# Patient Record
Sex: Male | Born: 1980 | Race: Black or African American | Hispanic: No | Marital: Married | Smoking: Never smoker
Health system: Southern US, Community
[De-identification: ages and names within clinical notes are randomized; demographics above are authoritative.]

## PROBLEM LIST (undated history)

## (undated) DIAGNOSIS — G43909 Migraine, unspecified, not intractable, without status migrainosus: Secondary | ICD-10-CM

## (undated) DIAGNOSIS — J329 Chronic sinusitis, unspecified: Secondary | ICD-10-CM

## (undated) DIAGNOSIS — J302 Other seasonal allergic rhinitis: Secondary | ICD-10-CM

## (undated) HISTORY — PX: TONSILLECTOMY: SUR1361

---

## 2003-08-19 ENCOUNTER — Emergency Department (HOSPITAL_COMMUNITY): Admission: EM | Admit: 2003-08-19 | Discharge: 2003-08-19 | Payer: Self-pay | Admitting: Emergency Medicine

## 2004-10-25 ENCOUNTER — Emergency Department (HOSPITAL_COMMUNITY): Admission: EM | Admit: 2004-10-25 | Discharge: 2004-10-25 | Payer: Self-pay | Admitting: Emergency Medicine

## 2004-10-27 ENCOUNTER — Emergency Department (HOSPITAL_COMMUNITY): Admission: EM | Admit: 2004-10-27 | Discharge: 2004-10-27 | Payer: Self-pay | Admitting: Emergency Medicine

## 2005-02-02 ENCOUNTER — Emergency Department (HOSPITAL_COMMUNITY): Admission: EM | Admit: 2005-02-02 | Discharge: 2005-02-02 | Payer: Self-pay | Admitting: Emergency Medicine

## 2005-11-05 ENCOUNTER — Emergency Department (HOSPITAL_COMMUNITY): Admission: EM | Admit: 2005-11-05 | Discharge: 2005-11-05 | Payer: Self-pay | Admitting: Emergency Medicine

## 2005-11-18 ENCOUNTER — Ambulatory Visit (HOSPITAL_COMMUNITY): Admission: RE | Admit: 2005-11-18 | Discharge: 2005-11-18 | Payer: Self-pay | Admitting: Chiropractic Medicine

## 2006-11-26 ENCOUNTER — Emergency Department (HOSPITAL_COMMUNITY): Admission: EM | Admit: 2006-11-26 | Discharge: 2006-11-26 | Payer: Self-pay | Admitting: Emergency Medicine

## 2006-12-02 ENCOUNTER — Emergency Department (HOSPITAL_COMMUNITY): Admission: EM | Admit: 2006-12-02 | Discharge: 2006-12-02 | Payer: Self-pay | Admitting: Emergency Medicine

## 2006-12-04 ENCOUNTER — Emergency Department (HOSPITAL_COMMUNITY): Admission: EM | Admit: 2006-12-04 | Discharge: 2006-12-04 | Payer: Self-pay | Admitting: Emergency Medicine

## 2007-03-30 ENCOUNTER — Emergency Department (HOSPITAL_COMMUNITY): Admission: EM | Admit: 2007-03-30 | Discharge: 2007-03-31 | Payer: Self-pay | Admitting: Emergency Medicine

## 2007-09-18 ENCOUNTER — Emergency Department (HOSPITAL_COMMUNITY): Admission: EM | Admit: 2007-09-18 | Discharge: 2007-09-18 | Payer: Self-pay | Admitting: Emergency Medicine

## 2011-06-22 ENCOUNTER — Emergency Department (HOSPITAL_COMMUNITY)
Admission: EM | Admit: 2011-06-22 | Discharge: 2011-06-22 | Disposition: A | Discharge status: Home / Self Care | Payer: Self-pay | Attending: Emergency Medicine | Admitting: Emergency Medicine

## 2011-06-22 ENCOUNTER — Encounter (HOSPITAL_COMMUNITY): Payer: Self-pay | Admitting: *Deleted

## 2011-06-22 DIAGNOSIS — R51 Headache: Secondary | ICD-10-CM | POA: Insufficient documentation

## 2011-06-22 DIAGNOSIS — R0602 Shortness of breath: Secondary | ICD-10-CM | POA: Insufficient documentation

## 2011-06-22 DIAGNOSIS — IMO0001 Reserved for inherently not codable concepts without codable children: Secondary | ICD-10-CM | POA: Insufficient documentation

## 2011-06-22 DIAGNOSIS — R109 Unspecified abdominal pain: Secondary | ICD-10-CM | POA: Insufficient documentation

## 2011-06-22 DIAGNOSIS — J111 Influenza due to unidentified influenza virus with other respiratory manifestations: Secondary | ICD-10-CM | POA: Insufficient documentation

## 2011-06-22 DIAGNOSIS — R5381 Other malaise: Secondary | ICD-10-CM | POA: Insufficient documentation

## 2011-06-22 DIAGNOSIS — R1110 Vomiting, unspecified: Secondary | ICD-10-CM | POA: Insufficient documentation

## 2011-06-22 DIAGNOSIS — R5383 Other fatigue: Secondary | ICD-10-CM | POA: Insufficient documentation

## 2011-06-22 HISTORY — DX: Other seasonal allergic rhinitis: J30.2

## 2011-06-22 HISTORY — DX: Chronic sinusitis, unspecified: J32.9

## 2011-06-22 HISTORY — DX: Migraine, unspecified, not intractable, without status migrainosus: G43.909

## 2011-06-22 MED ORDER — OSELTAMIVIR PHOSPHATE 75 MG PO CAPS: 75.0000 mg | ORAL_CAPSULE | Freq: Two times a day (BID) | ORAL | Status: AC

## 2011-06-22 MED ORDER — ONDANSETRON HCL 4 MG PO TABS: 4.0000 mg | ORAL_TABLET | Freq: Four times a day (QID) | ORAL | Status: AC

## 2011-06-22 MED ORDER — ACETAMINOPHEN 325 MG PO TABS
650.0000 mg | ORAL_TABLET | Freq: Once | ORAL | Status: AC
  Administered 2011-06-22: 650 mg via ORAL
  Filled 2011-06-22: qty 2

## 2011-06-22 NOTE — ED Notes (Signed)
C/o nausea, "feeling sick", onset yesterday noon, also HA, sob, weak legged & body aches. Took an old amox PTA (his Rx from dentist).

## 2011-06-22 NOTE — ED Notes (Signed)
Pt given grape juice 

## 2011-06-22 NOTE — ED Notes (Signed)
EDP at BS, family at BS. 

## 2011-06-22 NOTE — ED Notes (Signed)
Straight back to room from front door, not registered or triaged, back to exam room d/t HR, HR 118 at this time.

## 2011-06-22 NOTE — ED Provider Notes (Signed)
History     CSN: [id number]  Arrival date & time 06/22/11  0227   First MD Initiated Contact with Patient 06/22/11 0350      Chief Complaint  Patient presents with  . Nausea    (Consider location/radiation/quality/duration/timing/severity/associated sxs/prior treatment) HPI C/o nausea, "feeling sick", onset yesterday noon, also HA, sob, weak legged & body aches. Took an old amox PTA (his Rx from dentist).  Had a few episodes of vomiting but no diarrhea.  Had some chills felt feverish.  Bodyaches and headache.  Patient denies abdominal pain chest pain.  Past Medical History  Diagnosis Date  . Seasonal allergies   . Recurrent sinus infections   . Migraines     Past Surgical History  Procedure Date  . Tonsillectomy     Family History  Problem Relation Age of Onset  . Stroke Other     History  Substance Use Topics  . Smoking status: Never Smoker   . Smokeless tobacco: Not on file  . Alcohol Use: No      Review of Systems  All other systems reviewed and are negative.    Allergies  Review of patient's allergies indicates no known allergies.  Home Medications   Current Outpatient Rx  Name Route Sig Dispense Refill  . AMOXICILLIN 500 MG PO CAPS Oral Take 500 mg by mouth 3 (three) times daily.    Marland Kitchen ONDANSETRON HCL 4 MG PO TABS Oral Take 1 tablet (4 mg total) by mouth every 6 (six) hours. 12 tablet 0  . OSELTAMIVIR PHOSPHATE 75 MG PO CAPS Oral Take 1 capsule (75 mg total) by mouth every 12 (twelve) hours. 10 capsule 0    BP 102/49  Pulse 99  Temp(Src) 100.9 F (38.3 C) (Oral)  Resp 20  Ht 6\' 1"  (1.854 m)  Wt 257 lb (116.574 kg)  BMI 33.91 kg/m2  SpO2 97%  Physical Exam  Nursing note and vitals reviewed. Constitutional: He is oriented to person, place, and time. He appears well-developed and well-nourished. No distress.  HENT:  Head: Normocephalic and atraumatic.  Eyes: Pupils are equal, round, and reactive to light.  Neck: Normal range of motion.    Cardiovascular: Normal rate and intact distal pulses.   Pulmonary/Chest: No respiratory distress.  Abdominal: Normal appearance. He exhibits no distension. There is no tenderness. There is no rebound and no guarding.  Musculoskeletal: Normal range of motion.  Neurological: He is alert and oriented to person, place, and time. No cranial nerve deficit.  Skin: Skin is warm and dry. No rash noted.  Psychiatric: He has a normal mood and affect. His behavior is normal.    ED Course  Procedures (including critical care time)  Labs Reviewed - No data to display No results found.   1. Influenza       MDM        After treatment in the ED the patient feels back to baseline and wants to go home.   Nelia Shi, MD 06/22/11 [phone]

## 2011-06-22 NOTE — ED Notes (Signed)
Pt put on cardiac monitor and pulse ox.

## 2020-02-14 ENCOUNTER — Other Ambulatory Visit: Payer: Self-pay

## 2020-02-14 ENCOUNTER — Emergency Department (HOSPITAL_COMMUNITY)
Admission: EM | Admit: 2020-02-14 | Discharge: 2020-02-14 | Disposition: A | Discharge status: Home / Self Care | Payer: Self-pay | Attending: Emergency Medicine | Admitting: Emergency Medicine

## 2020-02-14 ENCOUNTER — Encounter (HOSPITAL_COMMUNITY): Payer: Self-pay | Admitting: *Deleted

## 2020-02-14 ENCOUNTER — Emergency Department (HOSPITAL_COMMUNITY): Payer: Self-pay

## 2020-02-14 DIAGNOSIS — R079 Chest pain, unspecified: Secondary | ICD-10-CM | POA: Insufficient documentation

## 2020-02-14 DIAGNOSIS — M79602 Pain in left arm: Secondary | ICD-10-CM | POA: Insufficient documentation

## 2020-02-14 DIAGNOSIS — Z5321 Procedure and treatment not carried out due to patient leaving prior to being seen by health care provider: Secondary | ICD-10-CM | POA: Insufficient documentation

## 2020-02-14 DIAGNOSIS — R2240 Localized swelling, mass and lump, unspecified lower limb: Secondary | ICD-10-CM | POA: Insufficient documentation

## 2020-02-14 LAB — BASIC METABOLIC PANEL
Anion gap: 12 (ref 5–15)
BUN: 16 mg/dL (ref 6–20)
CO2: 22 mmol/L (ref 22–32)
Calcium: 9.1 mg/dL (ref 8.9–10.3)
Chloride: 106 mmol/L (ref 98–111)
Creatinine, Ser: 1.67 mg/dL — ABNORMAL HIGH (ref 0.61–1.24)
GFR calc Af Amer: >60 mL/min (ref >60)
GFR calc non Af Amer: 52 mL/min — ABNORMAL LOW (ref >60)
Glucose, Bld: 94 mg/dL (ref 70–99)
Potassium: 4.2 mmol/L (ref 3.5–5.1)
Sodium: 140 mmol/L (ref 135–145)

## 2020-02-14 LAB — CBC
HCT: 45.8 % (ref 39.0–52.0)
Hemoglobin: 15.2 g/dL (ref 13.0–17.0)
MCH: 32.1 pg (ref 26.0–34.0)
MCHC: 33.2 g/dL (ref 30.0–36.0)
MCV: 96.6 fL (ref 80.0–100.0)
Platelets: 363 10*3/uL (ref 150–400)
RBC: 4.74 MIL/uL (ref 4.22–5.81)
RDW: 12.5 % (ref 11.5–15.5)
WBC: 4.5 10*3/uL (ref 4.0–10.5)
nRBC: 0 % (ref 0.0–0.2)

## 2020-02-14 LAB — TROPONIN I (HIGH SENSITIVITY): Troponin I (High Sensitivity): 4 ng/L (ref <18)

## 2020-02-14 NOTE — ED Notes (Signed)
Called for vitals and no response 

## 2020-02-14 NOTE — ED Triage Notes (Signed)
Pt reports having episodes of chest pain over past few days, today pain radiated down left arm. Reports recent mild swelling to legs. Denies sob. No acute distress noted at triage.

## 2021-12-24 IMAGING — DX DG CHEST 2V
2 series · 2 of 2 positions shown · non-contrast
Comparison: 09/18/2007 chest radiograph.

CLINICAL DATA: Pain

EXAM:
CHEST - 2 VIEW

[chest pa]
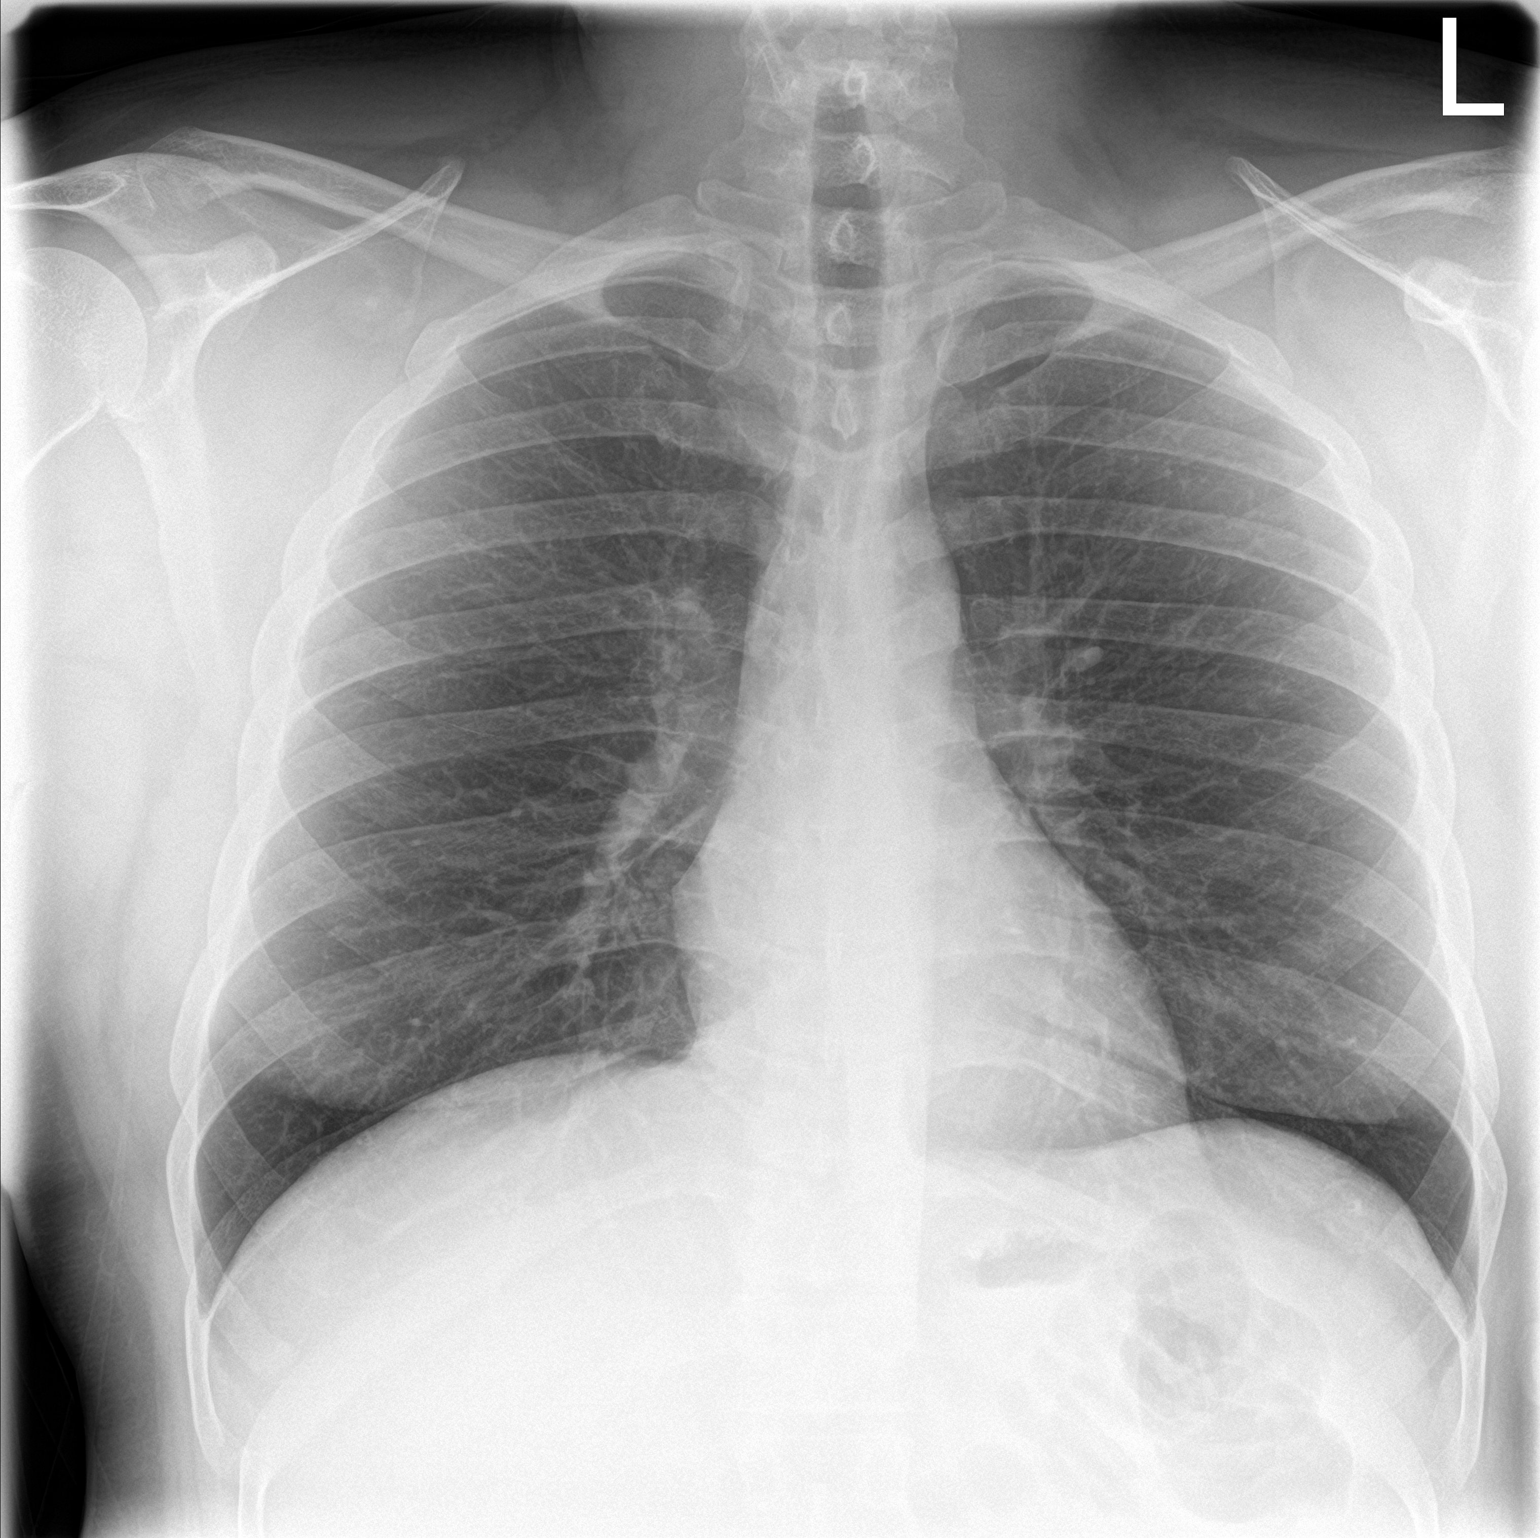

[chest lat]
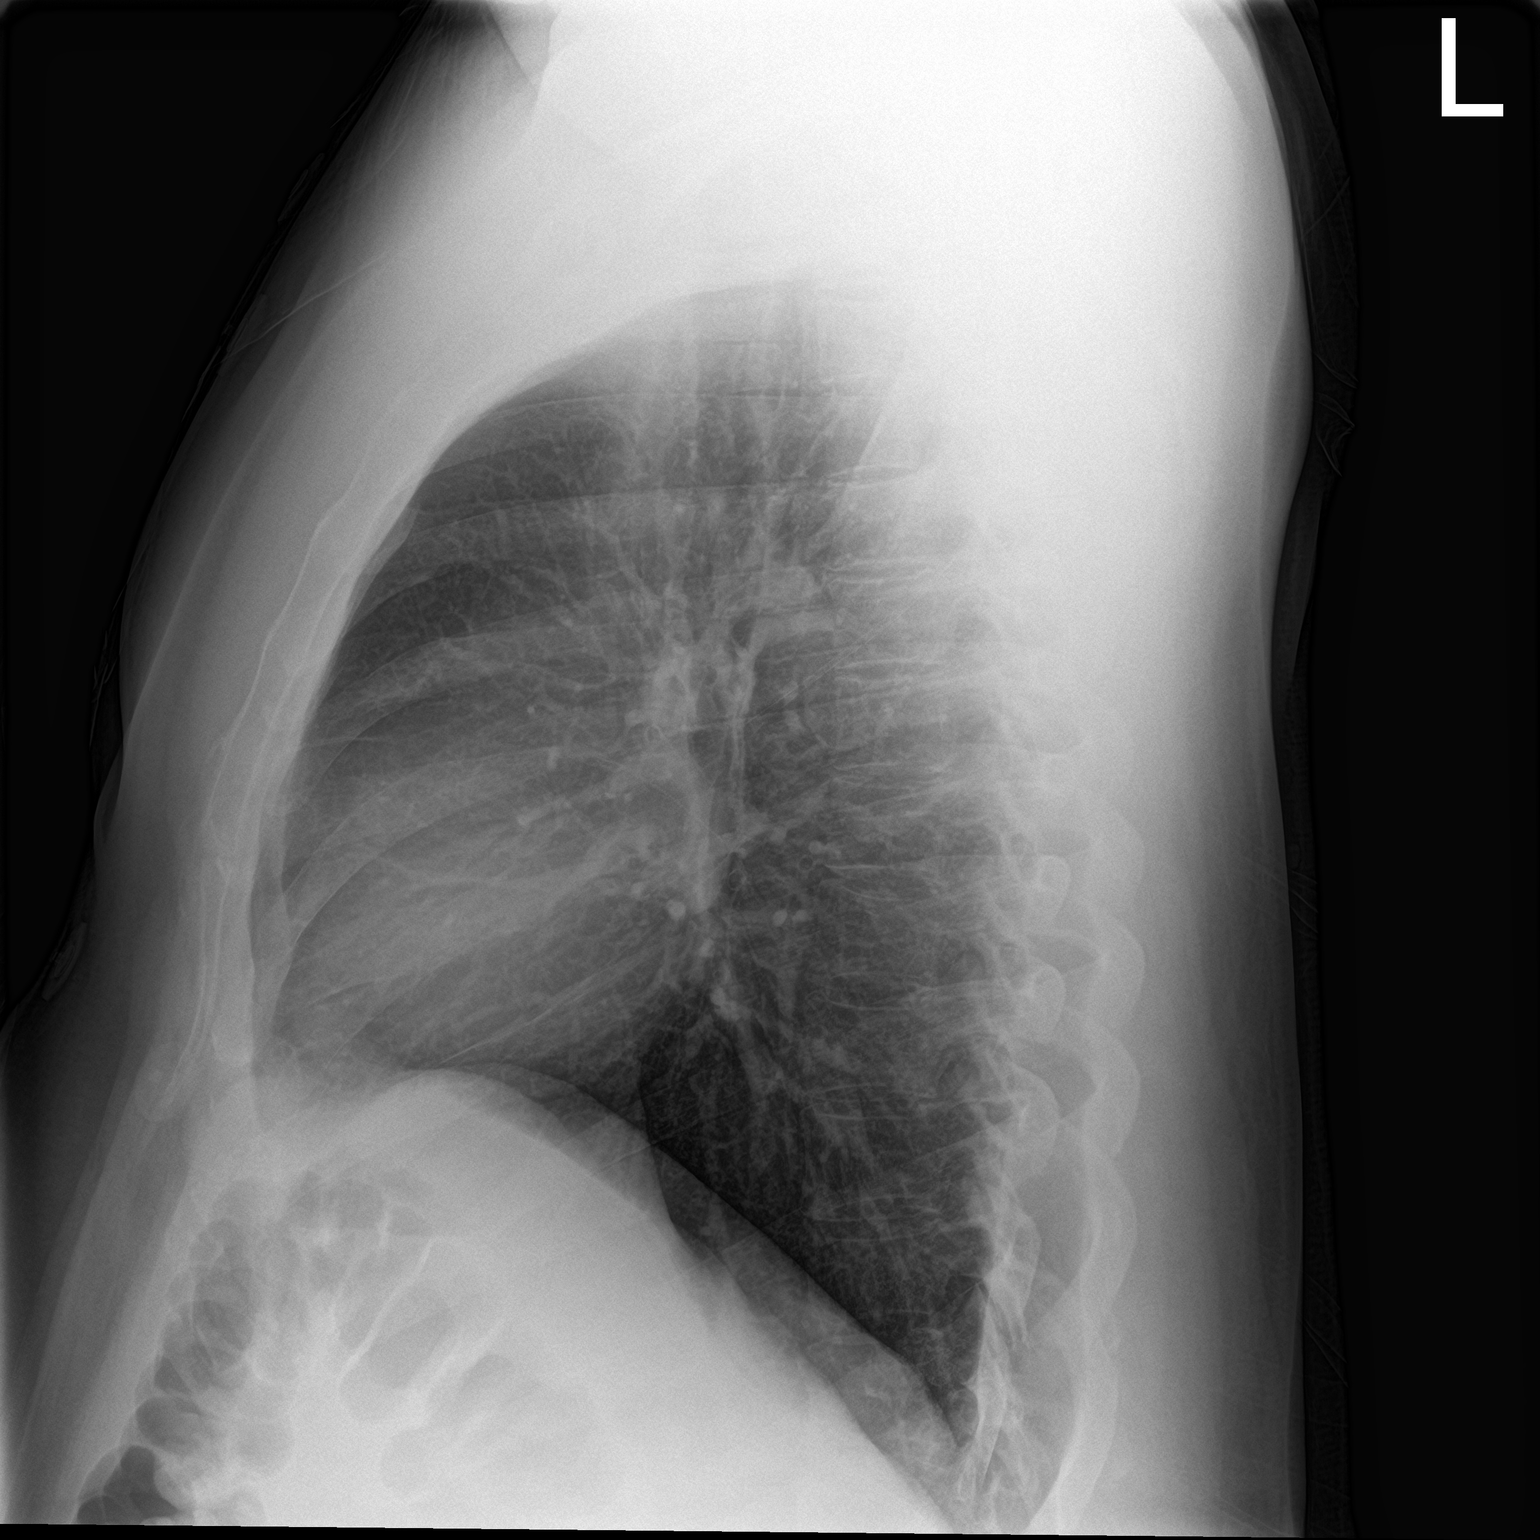

[2 of 2 positions shown; findings below may reference images not displayed]

FINDINGS: The heart size and mediastinal contours are within normal limits.
Both lungs are clear. No pneumothorax or pleural effusion. The
visualized skeletal structures are unremarkable.
IMPRESSION: No focal airspace disease.

## 2022-12-13 ENCOUNTER — Emergency Department (HOSPITAL_COMMUNITY): Payer: Self-pay

## 2022-12-13 ENCOUNTER — Emergency Department (HOSPITAL_COMMUNITY)
Admission: EM | Admit: 2022-12-13 | Discharge: 2022-12-13 | Discharge status: Against Medical Advice | Payer: Self-pay | Attending: Emergency Medicine | Admitting: Emergency Medicine

## 2022-12-13 DIAGNOSIS — I160 Hypertensive urgency: Secondary | ICD-10-CM | POA: Insufficient documentation

## 2022-12-13 DIAGNOSIS — N179 Acute kidney failure, unspecified: Secondary | ICD-10-CM | POA: Insufficient documentation

## 2022-12-13 DIAGNOSIS — Z5329 Procedure and treatment not carried out because of patient's decision for other reasons: Secondary | ICD-10-CM | POA: Insufficient documentation

## 2022-12-13 LAB — URINALYSIS, ROUTINE W REFLEX MICROSCOPIC
Bilirubin Urine: NEGATIVE
Glucose, UA: NEGATIVE mg/dL
Ketones, ur: NEGATIVE mg/dL
Leukocytes,Ua: NEGATIVE
Nitrite: NEGATIVE
Protein, ur: >=300 mg/dL — AB
Specific Gravity, Urine: 1.018 (ref 1.005–1.030)
pH: 5 (ref 5.0–8.0)

## 2022-12-13 LAB — CBC
HCT: 45.5 % (ref 39.0–52.0)
Hemoglobin: 15.1 g/dL (ref 13.0–17.0)
MCH: 33.6 pg (ref 26.0–34.0)
MCHC: 33.2 g/dL (ref 30.0–36.0)
MCV: 101.1 fL — ABNORMAL HIGH (ref 80.0–100.0)
Platelets: 434 10*3/uL — ABNORMAL HIGH (ref 150–400)
RBC: 4.5 MIL/uL (ref 4.22–5.81)
RDW: 13.1 % (ref 11.5–15.5)
WBC: 7.5 10*3/uL (ref 4.0–10.5)
nRBC: 0 % (ref 0.0–0.2)

## 2022-12-13 LAB — HEMOGLOBIN A1C
Hgb A1c MFr Bld: 5.1 % (ref 4.8–5.6)
Mean Plasma Glucose: 99.67 mg/dL

## 2022-12-13 LAB — BASIC METABOLIC PANEL
Anion gap: 13 (ref 5–15)
BUN: 19 mg/dL (ref 6–20)
CO2: 20 mmol/L — ABNORMAL LOW (ref 22–32)
Calcium: 8.6 mg/dL — ABNORMAL LOW (ref 8.9–10.3)
Chloride: 107 mmol/L (ref 98–111)
Creatinine, Ser: 2.67 mg/dL — ABNORMAL HIGH (ref 0.61–1.24)
GFR, Estimated: 30 mL/min — ABNORMAL LOW (ref >60)
Glucose, Bld: 122 mg/dL — ABNORMAL HIGH (ref 70–99)
Potassium: 3.7 mmol/L (ref 3.5–5.1)
Sodium: 140 mmol/L (ref 135–145)

## 2022-12-13 LAB — CBG MONITORING, ED: Glucose-Capillary: 119 mg/dL — ABNORMAL HIGH (ref 70–99)

## 2022-12-13 LAB — BRAIN NATRIURETIC PEPTIDE: B Natriuretic Peptide: 25.3 pg/mL (ref 0.0–100.0)

## 2022-12-13 MED ORDER — HYDRALAZINE HCL 25 MG PO TABS
50.0000 mg | ORAL_TABLET | Freq: Once | ORAL | Status: AC
Start: 2022-12-13 — End: 2022-12-13
  Administered 2022-12-13: 50 mg via ORAL
  Filled 2022-12-13: qty 2

## 2022-12-13 NOTE — ED Notes (Signed)
Patient was told by Dr Suezanne Jacquet he will be admitted in the hospital. Rn placed a IV in the patient arm, and also drew lab that were ordered by the MD. The IMTS MD came by to see the patient and he was not in his room. RN is not sure if the patient took the IV out before leaving or left with the [place] lock. The tech did call patient cell and left a message on his phone. Dr Suezanne Jacquet was made aware.

## 2022-12-13 NOTE — ED Notes (Signed)
Pt left AMA without signing a paperwork

## 2022-12-13 NOTE — ED Notes (Signed)
Pt eloped with IV. This tech called the pt and left a message requesting him to return to have IV removed.

## 2022-12-13 NOTE — ED Triage Notes (Addendum)
Pt to ED coming from work c/o HTN. Reports was feeling light headed at work, bp was taken and was told it was high. Reports does not take bp medications. Denies chest pain. Reports symptoms have resolved.

## 2022-12-13 NOTE — ED Provider Notes (Signed)
[name] [facility] DE[facility] AT [facility] Provider Note  CSN: [id number] Arrival date & time: 12/13/22 1513  Chief Complaint(s) Hypertension and Dizziness  HPI Charles Francis is a 42 y.o. male history of migraines presenting to the emergency department with high blood pressure.  Patient reports that he was at work earlier today, was having some lightheadedness and nausea when he was about to go home for the day early, his supervisor checked his blood pressure and apparently it was high.  They did not know what to do with it so they said it would probably be best to come to the emergency department.  Patient reports that he feels better now.  He does note that he has had some leg swelling over the past year, had some intermittently when he was previously homeless and sleeping in a car, now has a place to stay and able to sleep flat the leg swelling is improved but persistent.  He also reports polyuria, polydipsia.  No chest or abdominal pain, fevers or chills, headaches, numbness or tingling, weakness, fainting, or any other symptoms.  No dysuria.   Past Medical History Past Medical History:  Diagnosis Date   Migraines    Recurrent sinus infections    Seasonal allergies    There are no problems to display for this patient.  Home Medication(s) Prior to Admission medications   Medication Sig Start Date End Date Taking? Authorizing Provider  amoxicillin (AMOXIL) 500 MG capsule Take 500 mg by mouth 3 (three) times daily.    [provider]                                                                                                                                    Past Surgical History Past Surgical History:  Procedure Laterality Date   TONSILLECTOMY     Family History Family History  Problem Relation Age of Onset   Stroke Other     Social History Social History   Tobacco Use   Smoking status: Never  Substance Use Topics   Alcohol use: No   Drug use:  Yes    Types: Marijuana   Allergies Patient has no known allergies.  Review of Systems Review of Systems  All other systems reviewed and are negative.   Physical Exam Vital Signs  I have reviewed the triage vital signs BP (!) 180/125 (BP Location: Right Arm)   Pulse 84   Temp 98.2 F (36.8 C)   Resp 18   Ht 6\' 1"  (1.854 m)   Wt 134.7 kg   SpO2 100%   BMI 39.18 kg/m  Physical Exam Vitals and nursing note reviewed.  Constitutional:      General: He is not in acute distress.    Appearance: Normal appearance.  HENT:     Mouth/Throat:     Mouth: Mucous membranes are moist.  Eyes:     Conjunctiva/sclera: Conjunctivae normal.  Cardiovascular:  Rate and Rhythm: Normal rate and regular rhythm.  Pulmonary:     Effort: Pulmonary effort is normal. No respiratory distress.     Breath sounds: Normal breath sounds.  Abdominal:     General: Abdomen is flat.     Palpations: Abdomen is soft.     Tenderness: There is no abdominal tenderness.  Musculoskeletal:     Right lower leg: Edema present.     Left lower leg: Edema present.  Skin:    General: Skin is warm and dry.     Capillary Refill: Capillary refill takes less than 2 seconds.  Neurological:     Mental Status: He is alert and oriented to person, place, and time. Mental status is at baseline.     Comments: Cranial nerves II through XII intact, strength 5 out of 5 in the bilateral upper and lower extremities, no sensory deficit to light touch, no dysmetria on finger-nose-finger testing  Psychiatric:        Mood and Affect: Mood normal.        Behavior: Behavior normal.     ED Results and Treatments Labs (all labs ordered are listed, but only abnormal results are displayed) Labs Reviewed  BASIC METABOLIC PANEL - Abnormal; Notable for the following components:      Result Value   CO2 20 (*)    Glucose, Bld 122 (*)    Creatinine, Ser 2.67 (*)    Calcium 8.6 (*)    GFR, Estimated 30 (*)    All other components  within normal limits  CBC - Abnormal; Notable for the following components:   MCV 101.1 (*)    Platelets 434 (*)    All other components within normal limits  URINALYSIS, ROUTINE W REFLEX MICROSCOPIC - Abnormal; Notable for the following components:   Hgb urine dipstick SMALL (*)    Protein, ur >=300 (*)    Bacteria, UA RARE (*)    All other components within normal limits  CBG MONITORING, ED - Abnormal; Notable for the following components:   Glucose-Capillary 119 (*)    All other components within normal limits  HEMOGLOBIN A1C  BRAIN NATRIURETIC PEPTIDE                                                                                                                          Radiology DG Chest Port 1 View  Result Date: 12/13/2022 CLINICAL DATA:  Leg swelling EXAM: PORTABLE CHEST 1 VIEW COMPARISON:  X-ray 02/14/2020 FINDINGS: No consolidation, pneumothorax or effusion. Normal cardiopericardial silhouette without edema. Overlapping cardiac leads. IMPRESSION: No acute cardiopulmonary disease Electronically Signed   By: Karen Kays M.D.   On: 12/13/2022 17:35    Pertinent labs & imaging results that were available during my care of the patient were reviewed by me and considered in my medical decision making (see MDM for details).  Medications Ordered in ED Medications  hydrALAZINE (APRESOLINE) tablet 50 mg (50 mg Oral Given 12/13/22 1617)  Procedures Procedures  (including critical care time)  Medical Decision Making / ED Course   MDM:  42 year old male presenting to the emergency department with high blood pressure at work after episode of lightheadedness.  Patient well-appearing, physical exam notable for some bilateral lower extremity swelling.  Vitals notable for hypertension.  Initially was tachycardic in triage although this is improved.  Unclear  cause of episode of lightheadedness, patient reports that this is resolved, his neurologic exam is reassuring.  Labs obtained to evaluate for consequences of hypertension demonstrate abnormal kidney function creatinine almost of 3.  He was seen in the emergency department around 3 year ago and had an elevated creatinine of 1.67 so there may be some degree of chronicity to this.  He never was evaluated by physician and left without being seen.  He also has leg swelling.  He reports polyuria and polydipsia although his glucose is only mildly elevated.  Differential includes chronic kidney disease due to hypertension, chronic kidney disease due to nephrotic syndrome given polyuria and leg swelling, CHF, given abnormal findings in young patient with no medical history believe patient should be admitted to the hospital for further workup and evaluation to prevent further deterioration of his kidney function.  Clinical Course as of 12/13/22 1759  Tue Dec 13, 2022  1647 Discussed with hospitalist who will admit patient. [WS]  1736 Patient apparently eloped.  He did have an IV.  He did not pick up his phone but I did leave a voicemail.  Security/PD were notified given that the patient still with IV in place. [WS]    Clinical Course User Index [WS] Lonell Grandchild, MD     Additional history obtained: -External records from outside source obtained and reviewed including: Chart review including previous notes, labs, imaging, consultation notes including prior labs   Lab Tests: -I ordered, reviewed, and interpreted labs.   The pertinent results include:   Labs Reviewed  BASIC METABOLIC PANEL - Abnormal; Notable for the following components:      Result Value   CO2 20 (*)    Glucose, Bld 122 (*)    Creatinine, Ser 2.67 (*)    Calcium 8.6 (*)    GFR, Estimated 30 (*)    All other components within normal limits  CBC - Abnormal; Notable for the following components:   MCV 101.1 (*)    Platelets  434 (*)    All other components within normal limits  URINALYSIS, ROUTINE W REFLEX MICROSCOPIC - Abnormal; Notable for the following components:   Hgb urine dipstick SMALL (*)    Protein, ur >=300 (*)    Bacteria, UA RARE (*)    All other components within normal limits  CBG MONITORING, ED - Abnormal; Notable for the following components:   Glucose-Capillary 119 (*)    All other components within normal limits  HEMOGLOBIN A1C  BRAIN NATRIURETIC PEPTIDE    Notable for elevated creatinine, protienuria suggestive of nephrotic syndrome  EKG   EKG Interpretation Date/Time:  Tuesday December 13 2022 15:19:49 EDT Ventricular Rate:  121 PR Interval:  142 QRS Duration:  80 QT Interval:  314 QTC Calculation: 445 R Axis:   41  Text Interpretation: Sinus tachycardia Nonspecific ST and T wave abnormality Abnormal ECG When compared with ECG of 14-Feb-2020 09:05, No significant change since last tracing Confirmed by Alvino Blood ([id number]) on 12/13/2022 3:42:15 PM         Imaging Studies ordered: I ordered imaging studies including CXR On my  interpretation imaging demonstrates clear lungs I independently visualized and interpreted imaging. I agree with the radiologist interpretation   Medicines ordered and prescription drug management: Meds ordered this encounter  Medications   hydrALAZINE (APRESOLINE) tablet 50 mg    -I have reviewed the patients home medicines and have made adjustments as needed   Consultations Obtained: I requested consultation with the internal medicine team,  and discussed lab and imaging findings as well as pertinent plan - they recommend: admission   Cardiac Monitoring: The patient was maintained on a cardiac monitor.  I personally viewed and interpreted the cardiac monitored which showed an underlying rhythm of: NSR  Social Determinants of Health:  Diagnosis or treatment significantly limited by social determinants of health: obesity  Co morbidities that  complicate the patient evaluation  Past Medical History:  Diagnosis Date   Migraines    Recurrent sinus infections    Seasonal allergies       Dispostion: Disposition decision including need for hospitalization was considered, and patient Eloped    Final Clinical Impression(s) / ED Diagnoses Final diagnoses:  Hypertensive urgency  AKI (acute kidney injury) (HCC)     This chart was dictated using voice recognition software.  Despite best efforts to proofread,  errors can occur which can change the documentation meaning.    Lonell Grandchild, MD 12/13/22 [phone]

## 2022-12-13 NOTE — Progress Notes (Signed)
This MD came to admit patient to IMTS service. RN and I searched for patient and called imaging services for wherabouts. Patient was called on his cellphone and did not return phone call. He has eloped with IV. ED team left voicemail to return.  Our service will be on standby if patient decides to come for admission to the hospital. Will sign off for now.  Morene Crocker, MD
